# Patient Record
Sex: Male | Born: 1970 | Race: White | Hispanic: No | Marital: Single | Smoking: Never smoker
Health system: Southern US, Community
[De-identification: ages and names within clinical notes are randomized; demographics above are authoritative.]

## PROBLEM LIST (undated history)

## (undated) DIAGNOSIS — F419 Anxiety disorder, unspecified: Secondary | ICD-10-CM

## (undated) DIAGNOSIS — E119 Type 2 diabetes mellitus without complications: Secondary | ICD-10-CM

## (undated) HISTORY — DX: Anxiety disorder, unspecified: F41.9

## (undated) HISTORY — DX: Type 2 diabetes mellitus without complications: E11.9

---

## 2014-06-24 ENCOUNTER — Ambulatory Visit (INDEPENDENT_AMBULATORY_CARE_PROVIDER_SITE_OTHER): Payer: Self-pay | Admitting: Sports Medicine

## 2014-06-24 ENCOUNTER — Other Ambulatory Visit: Payer: Self-pay | Admitting: Adult Health

## 2014-06-24 ENCOUNTER — Ambulatory Visit (INDEPENDENT_AMBULATORY_CARE_PROVIDER_SITE_OTHER): Payer: Self-pay

## 2014-06-24 DIAGNOSIS — S62143A Displaced fracture of body of hamate [unciform] bone, unspecified wrist, initial encounter for closed fracture: Secondary | ICD-10-CM

## 2014-06-24 DIAGNOSIS — M25539 Pain in unspecified wrist: Secondary | ICD-10-CM

## 2014-06-24 DIAGNOSIS — W19XXXA Unspecified fall, initial encounter: Secondary | ICD-10-CM

## 2014-06-24 DIAGNOSIS — M79609 Pain in unspecified limb: Secondary | ICD-10-CM

## 2014-06-24 DIAGNOSIS — S62142A Displaced fracture of body of hamate [unciform] bone, left wrist, initial encounter for closed fracture: Secondary | ICD-10-CM | POA: Insufficient documentation

## 2014-06-24 NOTE — Progress Notes (Signed)
   Subjective:    I'm seeing this patient as a consultation for:  Laurance FlattenKaty Bess, NP - occupational medicine  CC: Left wrist injury  HPI: Earlier today this very pleasant 43 year old male slipped on some water and fell onto an outstretched left hand, he had immediate pain over the dorsum of the ulnar sided wrist, with radiation to the fifth metacarpophalangeal joint. Pain is severe, persistent, he was seen in occupational medicine where x-ray showed a possible fracture of the hamate, I was called for further evaluation and definitive treatment.  Past medical history, Surgical history, Family history not pertinant except as noted below, Social history, Allergies, and medications have been entered into the medical record, reviewed, and no changes needed.   Review of Systems: No headache, visual changes, nausea, vomiting, diarrhea, constipation, dizziness, abdominal pain, skin rash, fevers, chills, night sweats, weight loss, swollen lymph nodes, body aches, joint swelling, muscle aches, chest pain, shortness of breath, mood changes, visual or auditory hallucinations.   Objective:   General: Well Developed, well nourished, and in no acute distress.  Neuro/Psych: Alert and oriented x3, extra-ocular muscles intact, able to move all 4 extremities, sensation grossly intact. Skin: Warm and dry, no rashes noted.  Respiratory: Not using accessory muscles, speaking in full sentences, trachea midline.  Cardiovascular: Pulses palpable, no extremity edema. Abdomen: Does not appear distended. Left hand: Only minimal swelling, tender palpation over the dorsal hamate, good motion, good strength, neurovascularly intact distally, no pain over the snuffbox.  X-rays show what appear to be a small avulsion from the hamate, there is also what appears to be an old avulsion at the distal radioulnar joint.  Impression and Recommendations:   This case required medical decision making of moderate complexity.

## 2014-06-24 NOTE — Assessment & Plan Note (Addendum)
Occurred today at work. Velcro brace, return to see me in a week, we can consider short arm cast at that time. Right-handed duty only.  I billed a fracture code for this encounter, all subsequent visits will be post-op checks in the global period.

## 2014-07-01 ENCOUNTER — Encounter: Payer: Self-pay | Admitting: Sports Medicine

## 2014-07-01 DIAGNOSIS — Z0289 Encounter for other administrative examinations: Secondary | ICD-10-CM

## 2015-04-04 IMAGING — CR DG WRIST COMPLETE 3+V*L*
4 series · 4 of 4 positions shown · non-contrast
Comparison: None.

CLINICAL DATA: Hand and wrist and forearm pain after a fall today.

EXAM:
LEFT WRIST - COMPLETE 3+ VIEW

[view not recorded (1 of 4)]
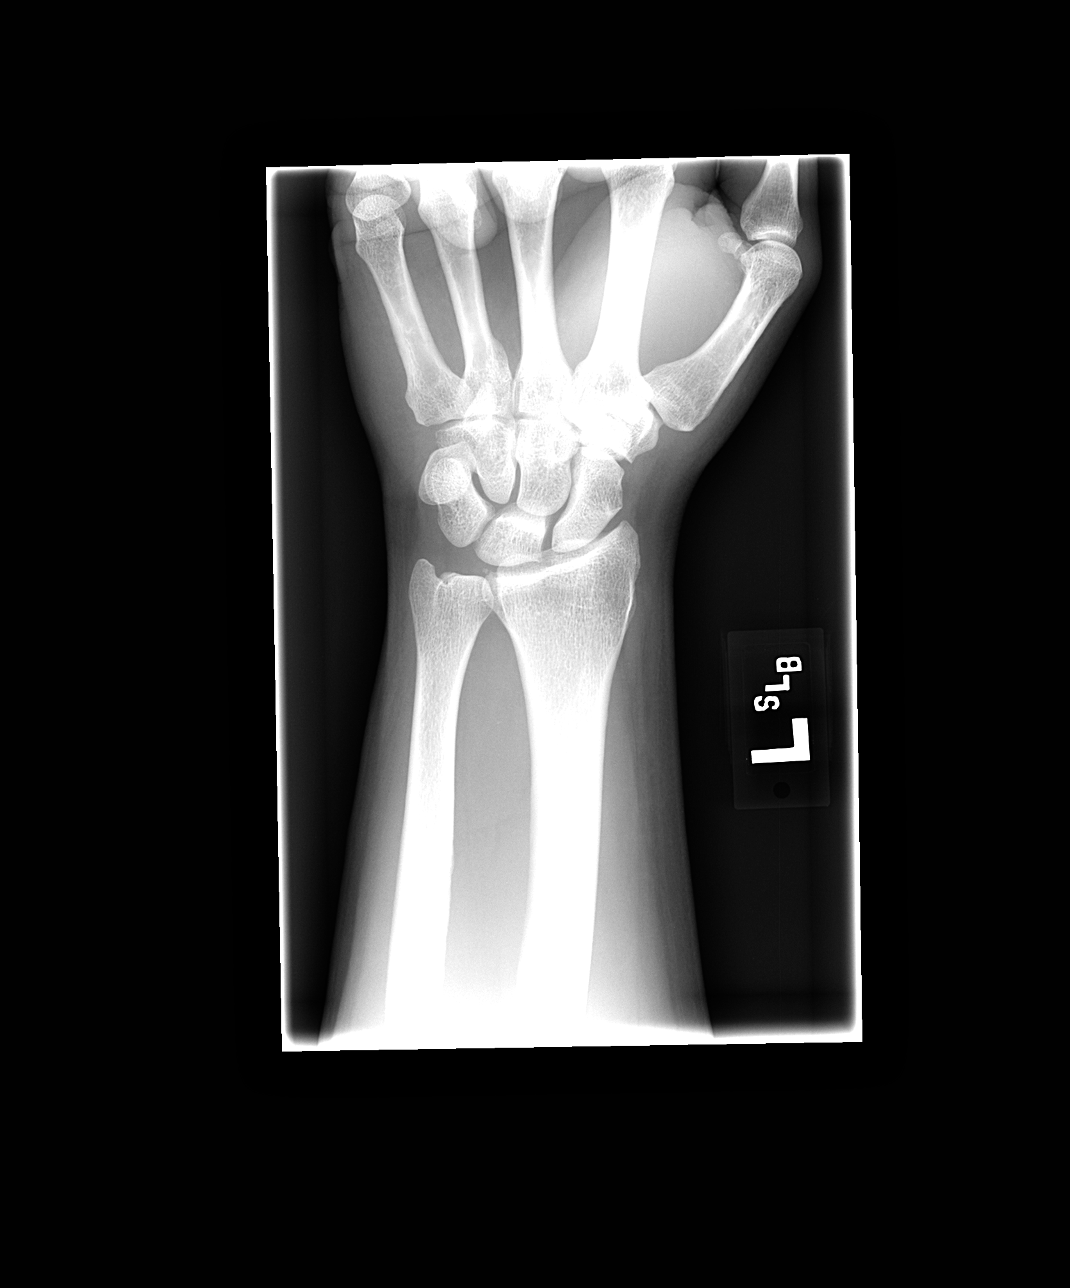

[view not recorded (2 of 4)]
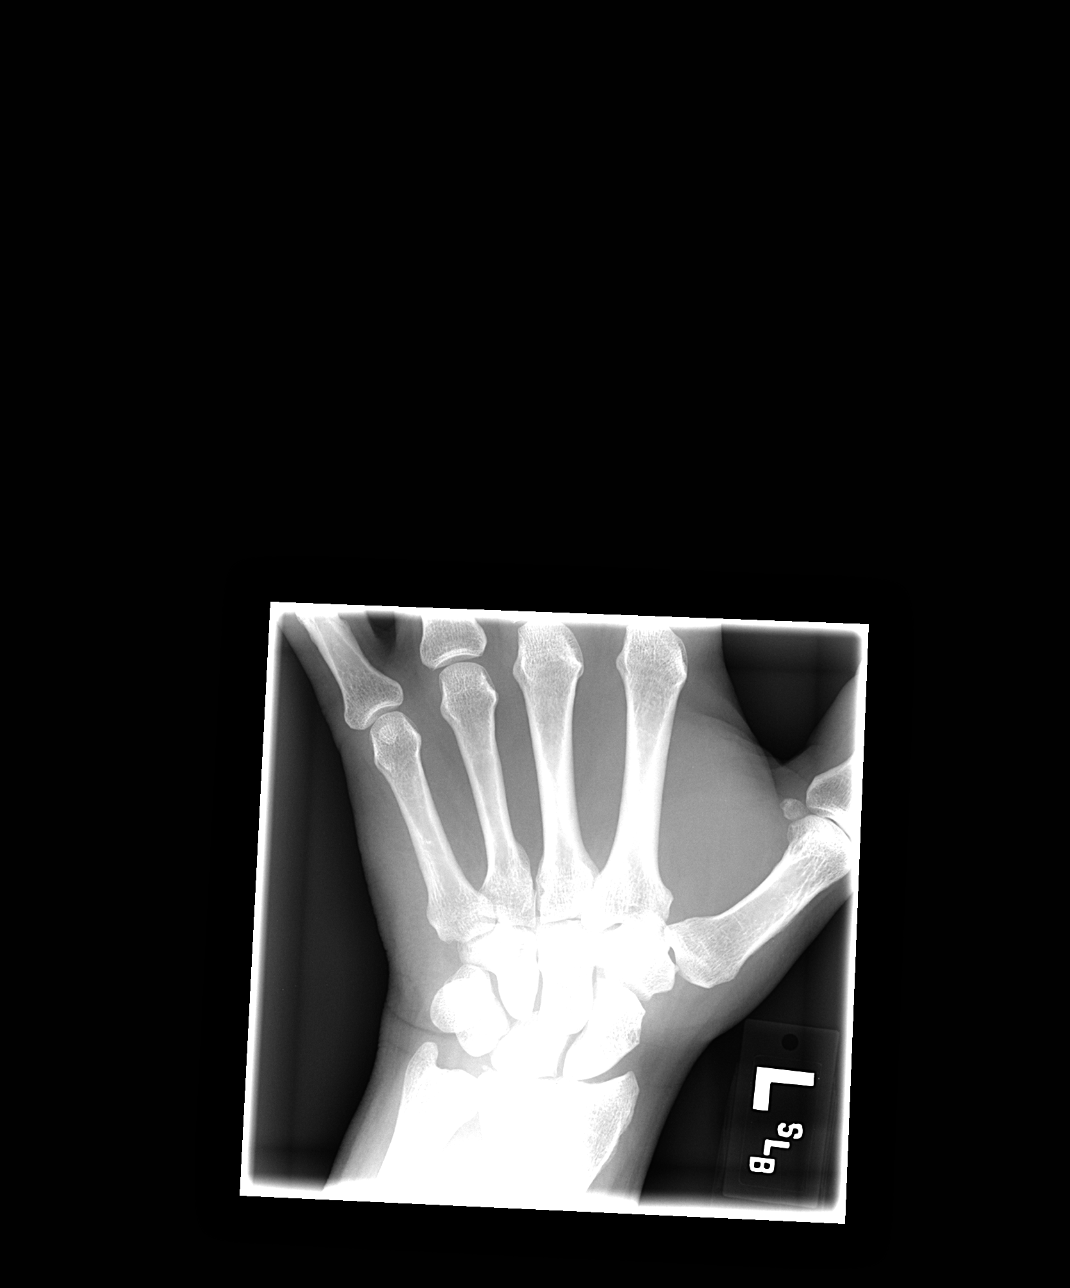

[view not recorded (3 of 4)]
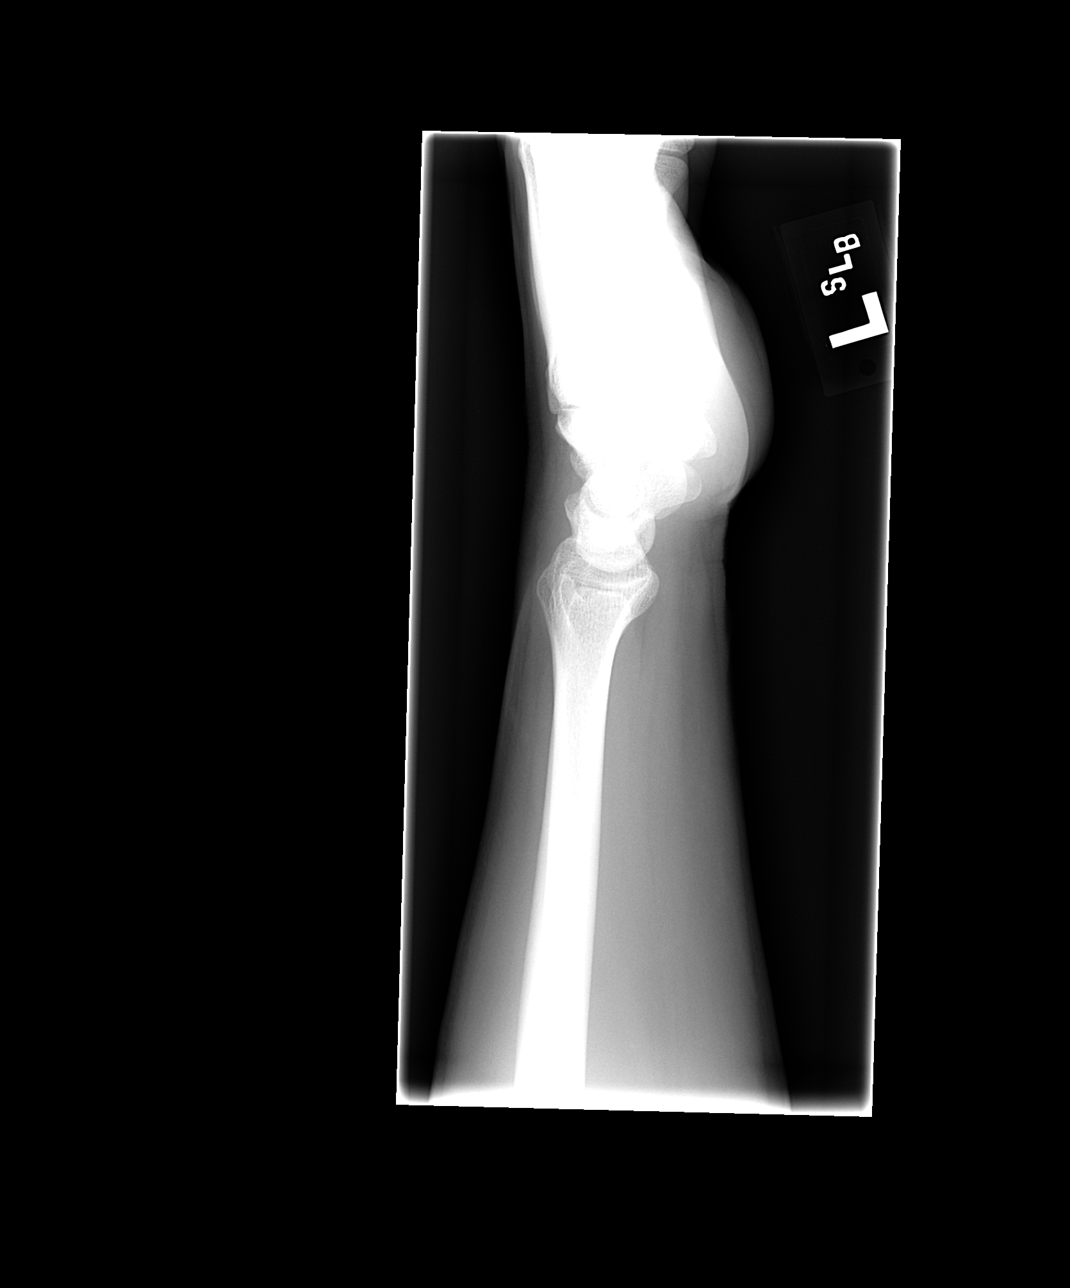

[view not recorded (4 of 4)]
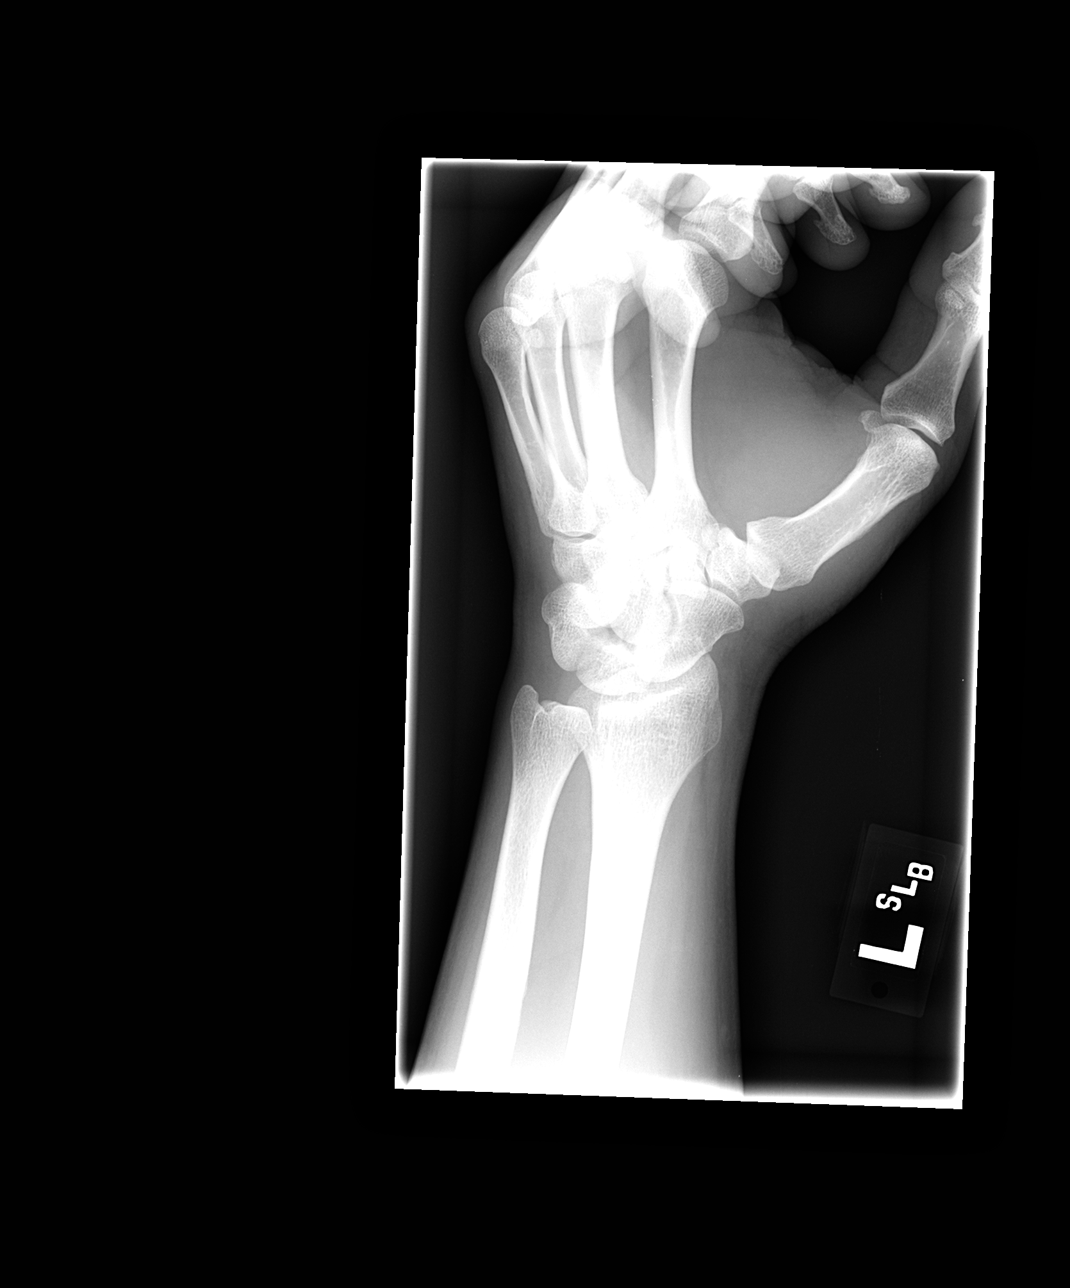

[4 of 4 positions shown; findings below may reference images not displayed]

FINDINGS: There is no fracture or dislocation. There is a tiny calcification
adjacent to the distal radius at the radial ulnar joint which could
be a tiny avulsion but this does not appear acute.
IMPRESSION: No significant abnormalities.

## 2023-08-09 NOTE — Progress Notes (Deleted)
Name: Keith Thornton DOB: 04-11-1971 MRN: [id number]  History of Present Illness: Mr. Pitzer is a 52 y.o. male who presents today as a new patient at [facility] [place]. All available relevant medical records have been reviewed.   Recent history:  > 07/13/2023: Seen by PCP for left flank pain and voiding dysfunction x2 days.  - UA showed trace blood.  - KUB negative for renal calculi.  - Patient was given a strainer.  - Placed on Flomax for urinary hesitancy/retention.   > No prior PSA results found per chart review.  Today: He {Actions; denies-reports:120008} symptomatic improvement since starting Flomax 0.4 mg daily.  He reports *** urinary stream. He {Actions; denies-reports:120008} urinary hesitancy, urgency, frequency, dysuria, gross hematuria, straining to void, or sensations of incomplete emptying.  He {Actions; denies-reports:120008} acute flank pain, abdominal pain, fevers, nausea, or vomiting.  He {Actions; denies-reports:120008} history of kidney stones.  He {Actions; denies-reports:120008} history of recent or recurrent UTI. He {Actions; denies-reports:120008} known history of prostate cancer.  ***DRE, PSA? ***CT stone?  Fall Screening: Do you usually have a device to assist in your mobility? {yes/no:20286} ***cane / ***walker / ***wheelchair  Medications: No current outpatient medications on file.   No current facility-administered medications for this visit.    Allergies: Not on File  No past medical history on file. *** The histories are not reviewed yet. Please review them in the "History" navigator section and refresh this SmartLink. No family history on file. Social History   Socioeconomic History   Marital status: Unknown    Spouse name: Not on file   Number of children: Not on file   Years of education: Not on file   Highest education level: Not on file  Occupational History   Not on file  Tobacco Use   Smoking status: Not on  file   Smokeless tobacco: Not on file  Substance and Sexual Activity   Alcohol use: Not on file   Drug use: Not on file   Sexual activity: Not on file  Other Topics Concern   Not on file  Social History Narrative   Not on file   Social Determinants of Health   Financial Resource Strain: Not on file  Food Insecurity: Not on file  Transportation Needs: Not on file  Physical Activity: Not on file  Stress: Not on file  Social Connections: Not on file  Intimate Partner Violence: Not on file    SUBJECTIVE  Review of Systems*** Constitutional: Patient denies any unintentional weight loss or change in strength lntegumentary: Patient denies any rashes or pruritus Eyes: Patient denies ***dry eyes ENT: Patient ***denies dry mouth Cardiovascular: Patient denies chest pain or syncope Respiratory: Patient denies shortness of breath Gastrointestinal: Patient ***denies nausea, vomiting, constipation, or diarrhea Musculoskeletal: Patient denies muscle cramps or weakness Neurologic: Patient denies convulsions or seizures Allergic/Immunologic: Patient denies recent allergic reaction(s) Hematologic/Lymphatic: Patient denies bleeding tendencies Endocrine: Patient denies heat/cold intolerance  GU: As per HPI.  OBJECTIVE There were no vitals filed for this visit. There is no height or weight on file to calculate BMI.  Physical Examination*** Constitutional: No obvious distress; patient is non-toxic appearing  Cardiovascular: No visible lower extremity edema.  Respiratory: The patient does not have audible wheezing/stridor; respirations do not appear labored  Gastrointestinal: Abdomen non-distended Musculoskeletal: Normal ROM of UEs  Skin: No obvious rashes/open sores  Neurologic: CN 2-12 grossly intact Psychiatric: Answered questions appropriately with normal affect  Hematologic/Lymphatic/Immunologic: No obvious bruises or sites of spontaneous bleeding  UA: ***negative *** WBC/hpf,  *** RBC/hpf, bacteria (***) PVR: *** ml  ASSESSMENT No diagnosis found. ***  Will plan for follow up in *** months or sooner if needed. Pt verbalized understanding and agreement. All questions were answered.  PLAN Advised the following: *** ***No follow-ups on file.  No orders of the defined types were placed in this encounter.   It has been explained that the patient is to follow regularly with their PCP in addition to all other providers involved in their care and to follow instructions provided by these respective offices. Patient advised to contact urology clinic if any urologic-pertaining questions, concerns, new symptoms or problems arise in the interim period.  There are no Patient Instructions on file for this visit.  Electronically signed by:  Donnita Falls, MSN, FNP-C, CUNP 08/09/2023 12:52 PM

## 2023-08-12 ENCOUNTER — Ambulatory Visit: Payer: Medicaid Other | Admitting: Urology

## 2023-08-12 DIAGNOSIS — R109 Unspecified abdominal pain: Secondary | ICD-10-CM

## 2023-08-12 DIAGNOSIS — R3911 Hesitancy of micturition: Secondary | ICD-10-CM

## 2023-08-15 NOTE — Progress Notes (Unsigned)
New Patient Office Visit  Subjective    Patient ID: Keith Thornton, male    DOB: [birth year]  Age: 52 y.o. MRN: [id number]  CC: No chief complaint on file.   HPI Keith Thornton presents to establish care ***  No outpatient encounter medications on file as of 08/16/2023.   No facility-administered encounter medications on file as of 08/16/2023.    No past medical history on file.  *** The histories are not reviewed yet. Please review them in the "History" navigator section and refresh this SmartLink.  No family history on file.  Social History   Socioeconomic History   Marital status: Unknown    Spouse name: Not on file   Number of children: Not on file   Years of education: Not on file   Highest education level: Not on file  Occupational History   Not on file  Tobacco Use   Smoking status: Not on file   Smokeless tobacco: Not on file  Substance and Sexual Activity   Alcohol use: Not on file   Drug use: Not on file   Sexual activity: Not on file  Other Topics Concern   Not on file  Social History Narrative   Not on file   Social Determinants of Health   Financial Resource Strain: Not on file  Food Insecurity: Not on file  Transportation Needs: Not on file  Physical Activity: Not on file  Stress: Not on file  Social Connections: Not on file  Intimate Partner Violence: Not on file    ROS Negative unless indicated in HPI   Objective    There were no vitals taken for this visit.  Physical Exam       Assessment & Plan:  There are no diagnoses linked to this encounter. Continue healthy lifestyle choices, including diet (rich in fruits, vegetables, and lean proteins, and low in salt and simple carbohydrates) and exercise (at least 30 minutes of moderate physical activity daily).     The above assessment and management plan was discussed with the patient. The patient verbalized understanding of and has agreed to the management plan. Patient is aware to  call the clinic if they develop any new symptoms or if symptoms persist or worsen. Patient is aware when to return to the clinic for a follow-up visit. Patient educated on when it is appropriate to go to the emergency department.  No follow-ups on file.   Arrie Aran Santa Lighter, DNP [facility] [address] [place], [place] [address] [phone]

## 2023-08-16 ENCOUNTER — Encounter: Payer: Self-pay | Admitting: Nurse Practitioner

## 2023-08-16 ENCOUNTER — Ambulatory Visit: Payer: Medicaid Other | Admitting: Nurse Practitioner

## 2023-08-16 VITALS — BP 118/69 | HR 71 | Temp 97.9°F | Ht 69.0 in | Wt 183.8 lb

## 2023-08-16 DIAGNOSIS — Z0000 Encounter for general adult medical examination without abnormal findings: Secondary | ICD-10-CM | POA: Insufficient documentation

## 2023-08-16 DIAGNOSIS — Z125 Encounter for screening for malignant neoplasm of prostate: Secondary | ICD-10-CM | POA: Diagnosis not present

## 2023-08-16 DIAGNOSIS — M545 Low back pain, unspecified: Secondary | ICD-10-CM | POA: Insufficient documentation

## 2023-08-16 DIAGNOSIS — E1122 Type 2 diabetes mellitus with diabetic chronic kidney disease: Secondary | ICD-10-CM | POA: Diagnosis not present

## 2023-08-16 DIAGNOSIS — Z7984 Long term (current) use of oral hypoglycemic drugs: Secondary | ICD-10-CM

## 2023-08-16 DIAGNOSIS — Z0001 Encounter for general adult medical examination with abnormal findings: Secondary | ICD-10-CM | POA: Insufficient documentation

## 2023-08-16 DIAGNOSIS — Z1211 Encounter for screening for malignant neoplasm of colon: Secondary | ICD-10-CM

## 2023-08-16 DIAGNOSIS — R339 Retention of urine, unspecified: Secondary | ICD-10-CM

## 2023-08-16 LAB — URINALYSIS, ROUTINE W REFLEX MICROSCOPIC
Bilirubin, UA: NEGATIVE
Leukocytes,UA: NEGATIVE
Nitrite, UA: NEGATIVE
Protein,UA: NEGATIVE
RBC, UA: NEGATIVE
Specific Gravity, UA: 1.025 (ref 1.005–1.030)
Urobilinogen, Ur: 0.2 mg/dL (ref 0.2–1.0)
pH, UA: 6.5 (ref 5.0–7.5)

## 2023-08-16 MED ORDER — TAMSULOSIN HCL 0.4 MG PO CAPS
0.4000 mg | ORAL_CAPSULE | Freq: Every day | ORAL | 1 refills | Status: DC
Start: 2023-08-16 — End: 2023-09-07

## 2023-08-16 MED ORDER — ACCU-CHEK AVIVA PLUS VI STRP: ORAL_STRIP | 2 refills | Status: AC

## 2023-08-17 ENCOUNTER — Encounter: Payer: Self-pay | Admitting: Nurse Practitioner

## 2023-08-17 ENCOUNTER — Other Ambulatory Visit: Payer: Self-pay | Admitting: Nurse Practitioner

## 2023-08-17 DIAGNOSIS — R339 Retention of urine, unspecified: Secondary | ICD-10-CM

## 2023-08-17 LAB — LIPID PANEL
Chol/HDL Ratio: 4 {ratio} (ref 0.0–5.0)
Cholesterol, Total: 148 mg/dL (ref 100–199)
HDL: 37 mg/dL — ABNORMAL LOW (ref >39)
LDL Chol Calc (NIH): 86 mg/dL (ref 0–99)
Triglycerides: 140 mg/dL (ref 0–149)
VLDL Cholesterol Cal: 25 mg/dL (ref 5–40)

## 2023-08-17 LAB — CBC WITH DIFFERENTIAL/PLATELET
Basophils Absolute: 0.1 10*3/uL (ref 0.0–0.2)
Basos: 1 %
EOS (ABSOLUTE): 0 10*3/uL (ref 0.0–0.4)
Eos: 1 %
Hematocrit: 51 % (ref 37.5–51.0)
Hemoglobin: 16.4 g/dL (ref 13.0–17.7)
Immature Grans (Abs): 0 10*3/uL (ref 0.0–0.1)
Immature Granulocytes: 0 %
Lymphocytes Absolute: 1.6 10*3/uL (ref 0.7–3.1)
Lymphs: 26 %
MCH: 27.9 pg (ref 26.6–33.0)
MCHC: 32.2 g/dL (ref 31.5–35.7)
MCV: 87 fL (ref 79–97)
Monocytes Absolute: 0.4 10*3/uL (ref 0.1–0.9)
Monocytes: 7 %
Neutrophils Absolute: 4.2 10*3/uL (ref 1.4–7.0)
Neutrophils: 65 %
Platelets: 187 10*3/uL (ref 150–450)
RBC: 5.88 x10E6/uL — ABNORMAL HIGH (ref 4.14–5.80)
RDW: 12.8 % (ref 11.6–15.4)
WBC: 6.3 10*3/uL (ref 3.4–10.8)

## 2023-08-17 LAB — THYROID PANEL WITH TSH
Free Thyroxine Index: 2.4 (ref 1.2–4.9)
T3 Uptake Ratio: 30 % (ref 24–39)
T4, Total: 8 ug/dL (ref 4.5–12.0)
TSH: 1.33 u[IU]/mL (ref 0.450–4.500)

## 2023-08-17 LAB — CMP14+EGFR
ALT: 12 [IU]/L (ref 0–44)
AST: 17 [IU]/L (ref 0–40)
Albumin: 4.6 g/dL (ref 3.8–4.9)
Alkaline Phosphatase: 75 [IU]/L (ref 44–121)
BUN/Creatinine Ratio: 9 (ref 9–20)
BUN: 10 mg/dL (ref 6–24)
Bilirubin Total: 0.9 mg/dL (ref 0.0–1.2)
CO2: 23 mmol/L (ref 20–29)
Calcium: 9.5 mg/dL (ref 8.7–10.2)
Chloride: 99 mmol/L (ref 96–106)
Creatinine, Ser: 1.06 mg/dL (ref 0.76–1.27)
Globulin, Total: 2.5 g/dL (ref 1.5–4.5)
Glucose: 178 mg/dL — ABNORMAL HIGH (ref 70–99)
Potassium: 4.1 mmol/L (ref 3.5–5.2)
Sodium: 136 mmol/L (ref 134–144)
Total Protein: 7.1 g/dL (ref 6.0–8.5)
eGFR: 84 mL/min/{1.73_m2} (ref >59)

## 2023-08-17 LAB — MICROALBUMIN / CREATININE URINE RATIO
Creatinine, Urine: 146.7 mg/dL
Microalb/Creat Ratio: 7 mg/g{creat} (ref 0–29)
Microalbumin, Urine: 9.6 ug/mL

## 2023-08-17 LAB — PSA, TOTAL AND FREE
PSA, Free Pct: 27.6 %
PSA, Free: 1.85 ng/mL
Prostate Specific Ag, Serum: 6.7 ng/mL — ABNORMAL HIGH (ref 0.0–4.0)

## 2023-08-24 ENCOUNTER — Telehealth: Payer: Self-pay | Admitting: Nurse Practitioner

## 2023-08-24 NOTE — Telephone Encounter (Signed)
Spoke with pt and answered all questions

## 2023-09-07 ENCOUNTER — Other Ambulatory Visit: Payer: Self-pay | Admitting: Nurse Practitioner

## 2023-09-07 DIAGNOSIS — R339 Retention of urine, unspecified: Secondary | ICD-10-CM

## 2023-09-08 LAB — COLOGUARD: COLOGUARD: NEGATIVE

## 2023-11-10 NOTE — Progress Notes (Deleted)
 Established Patient Office Visit  Subjective  Patient ID: Keith Thornton, male    DOB: 1971/02/20  Age: 53 y.o. MRN: [id number]  No chief complaint on file.   HPI Diabetes Mellitus Type II, Follow-up  No results found for: HGBA1C Wt Readings from Last 3 Encounters:  08/16/23 183 lb 12.8 oz (83.4 kg)   Last seen for diabetes {1-12:18279} {days/wks/mos/yrs:310907} ago.  Management since then includes ***. He reports {excellent/good/fair/poor:19665} compliance with treatment. He {is/is not:21021397} having side effects. {document side effects if present:1} Symptoms: {Yes/No:20286} fatigue {Yes/No:20286} foot ulcerations  {Yes/No:20286} appetite changes {Yes/No:20286} nausea  {Yes/No:20286} paresthesia of the feet  {Yes/No:20286} polydipsia  {Yes/No:20286} polyuria {Yes/No:20286} visual disturbances   {Yes/No:20286} vomiting     Home blood sugar records: {diabetes glucometry results:16657}  Episodes of hypoglycemia? {Yes/No:20286} {enter symptoms and frequency of symptoms if yes:1}   Current insulin regiment: {enter 'none' or type of insulin and number of units taken with each dose of each insulin formulation that the patient is taking:1} Most Recent Eye Exam: *** {Current exercise:16438:::1} {Current diet habits:16563:::1}  Pertinent Labs: Lab Results  Component Value Date   CHOL 148 08/16/2023   HDL 37 (L) 08/16/2023   LDLCALC 86 08/16/2023   TRIG 140 08/16/2023   CHOLHDL 4.0 08/16/2023   Lab Results  Component Value Date   NA 136 08/16/2023   K 4.1 08/16/2023   CREATININE 1.06 08/16/2023   EGFR 84 08/16/2023   MICRALBCREAT 7 08/16/2023       Patient Active Problem List   Diagnosis Date Noted   Type 2 diabetes mellitus with diabetic chronic kidney disease (HCC) 08/16/2023   Bilateral low back pain 08/16/2023   Screening for prostate cancer 08/16/2023   Routine medical exam 08/16/2023   Encounter for general adult medical examination with abnormal findings  08/16/2023   Past Medical History:  Diagnosis Date   Anxiety    Diabetes mellitus without complication (HCC)    No past surgical history on file. Social History   Tobacco Use   Smoking status: Never   Smokeless tobacco: Never  Vaping Use   Vaping status: Never Used  Substance Use Topics   Alcohol use: Never   Drug use: Never   Social History   Socioeconomic History   Marital status: Single    Spouse name: Not on file   Number of children: Not on file   Years of education: Not on file   Highest education level: Not on file  Occupational History   Not on file  Tobacco Use   Smoking status: Never   Smokeless tobacco: Never  Vaping Use   Vaping status: Never Used  Substance and Sexual Activity   Alcohol use: Never   Drug use: Never   Sexual activity: Yes  Other Topics Concern   Not on file  Social History Narrative   Not on file   Social Drivers of Health   Financial Resource Strain: Low Risk  (08/16/2023)   Overall Financial Resource Strain (CARDIA)    Difficulty of Paying Living Expenses: Not hard at all  Food Insecurity: Unknown (08/16/2023)   Hunger Vital Sign    Worried About Running Out of Food in the Last Year: Never true    Ran Out of Food in the Last Year: Not on file  Transportation Needs: No Transportation Needs (08/16/2023)   PRAPARE - Administrator, Civil Service (Medical): No    Lack of Transportation (Non-Medical): No  Physical Activity: Not on file  Stress: Not on file  Social Connections: Not on file  Intimate Partner Violence: Not At Risk (08/16/2023)   Humiliation, Afraid, Rape, and Kick questionnaire    Fear of Current or Ex-Partner: No    Emotionally Abused: No    Physically Abused: No    Sexually Abused: No   Family Status  Relation Name Status   Mother  Alive   Father  Alive   MGM  Deceased   MGF  Deceased   PGM  Deceased   PGF  Deceased  No partnership data on file   Family History  Problem Relation Age of Onset    Diabetes Mother    Diabetes Father    Allergies  Allergen Reactions   Azithromycin Palpitations   Ondansetron Palpitations      ROS Negative unless indicated in HPI   Objective:     There were no vitals taken for this visit. BP Readings from Last 3 Encounters:  08/16/23 118/69   Wt Readings from Last 3 Encounters:  08/16/23 183 lb 12.8 oz (83.4 kg)      Physical Exam   No results found for any visits on 11/16/23.  Last CBC Lab Results  Component Value Date   WBC 6.3 08/16/2023   HGB 16.4 08/16/2023   HCT 51.0 08/16/2023   MCV 87 08/16/2023   MCH 27.9 08/16/2023   RDW 12.8 08/16/2023   PLT 187 08/16/2023   Last metabolic panel Lab Results  Component Value Date   GLUCOSE 178 (H) 08/16/2023   NA 136 08/16/2023   K 4.1 08/16/2023   CL 99 08/16/2023   CO2 23 08/16/2023   BUN 10 08/16/2023   CREATININE 1.06 08/16/2023   EGFR 84 08/16/2023   CALCIUM 9.5 08/16/2023   PROT 7.1 08/16/2023   ALBUMIN 4.6 08/16/2023   LABGLOB 2.5 08/16/2023   BILITOT 0.9 08/16/2023   ALKPHOS 75 08/16/2023   AST 17 08/16/2023   ALT 12 08/16/2023   Last lipids Lab Results  Component Value Date   CHOL 148 08/16/2023   HDL 37 (L) 08/16/2023   LDLCALC 86 08/16/2023   TRIG 140 08/16/2023   CHOLHDL 4.0 08/16/2023   Last hemoglobin A1c No results found for: HGBA1C Last thyroid  functions Lab Results  Component Value Date   TSH 1.330 08/16/2023   T4TOTAL 8.0 08/16/2023        Assessment & Plan:  There are no diagnoses linked to this encounter. Continue healthy lifestyle choices, including diet (rich in fruits, vegetables, and lean proteins, and low in salt and simple carbohydrates) and exercise (at least 30 minutes of moderate physical activity daily).     The above assessment and management plan was discussed with the patient. The patient verbalized understanding of and has agreed to the management plan. Patient is aware to call the clinic if they develop any new  symptoms or if symptoms persist or worsen. Patient is aware when to return to the clinic for a follow-up visit. Patient educated on when it is appropriate to go to the emergency department.  No follow-ups on file.    Keith Marcum [place] Thompson, DNP [facility] [address] [place], [place] [address] [phone]    Note: This document was prepared by [employer] voice dictation technology and any errors that results from this process are unintentional.

## 2023-11-16 ENCOUNTER — Encounter: Payer: Self-pay | Admitting: Nurse Practitioner

## 2023-11-16 ENCOUNTER — Ambulatory Visit: Payer: Medicaid Other | Admitting: Nurse Practitioner
